# Patient Record
Sex: Male | Born: 1975 | Race: White | Hispanic: Yes | Marital: Single | Smoking: Never smoker
Health system: Southern US, Community
[De-identification: ages and names within clinical notes are randomized; demographics above are authoritative.]

## PROBLEM LIST (undated history)

## (undated) DIAGNOSIS — M5430 Sciatica, unspecified side: Secondary | ICD-10-CM

---

## 2003-01-18 ENCOUNTER — Observation Stay (HOSPITAL_COMMUNITY): Admission: EM | Admit: 2003-01-18 | Discharge: 2003-01-19 | Payer: Self-pay | Admitting: Emergency Medicine

## 2003-01-18 ENCOUNTER — Encounter (INDEPENDENT_AMBULATORY_CARE_PROVIDER_SITE_OTHER): Payer: Self-pay

## 2005-05-20 ENCOUNTER — Emergency Department (HOSPITAL_COMMUNITY): Admission: EM | Admit: 2005-05-20 | Discharge: 2005-05-20 | Payer: Self-pay | Admitting: Family Medicine

## 2005-05-25 ENCOUNTER — Encounter: Admission: RE | Admit: 2005-05-25 | Discharge: 2005-05-25 | Payer: Self-pay | Admitting: Family Medicine

## 2008-03-28 ENCOUNTER — Emergency Department (HOSPITAL_COMMUNITY): Admission: EM | Admit: 2008-03-28 | Discharge: 2008-03-28 | Payer: Self-pay | Admitting: Family Medicine

## 2008-03-30 ENCOUNTER — Ambulatory Visit: Payer: Self-pay | Admitting: Cardiovascular Disease

## 2008-03-30 ENCOUNTER — Encounter: Payer: Self-pay | Admitting: Cardiovascular Disease

## 2008-03-30 ENCOUNTER — Ambulatory Visit: Payer: Self-pay

## 2009-10-30 IMAGING — CR DG CHEST 2V
2 series · 2 of 2 positions shown · non-contrast
Comparison: None.

CLINICAL DATA: Chest pain.  Shortness of breath.

CHEST - 2 VIEW 03/28/2008:

[w chest pa]
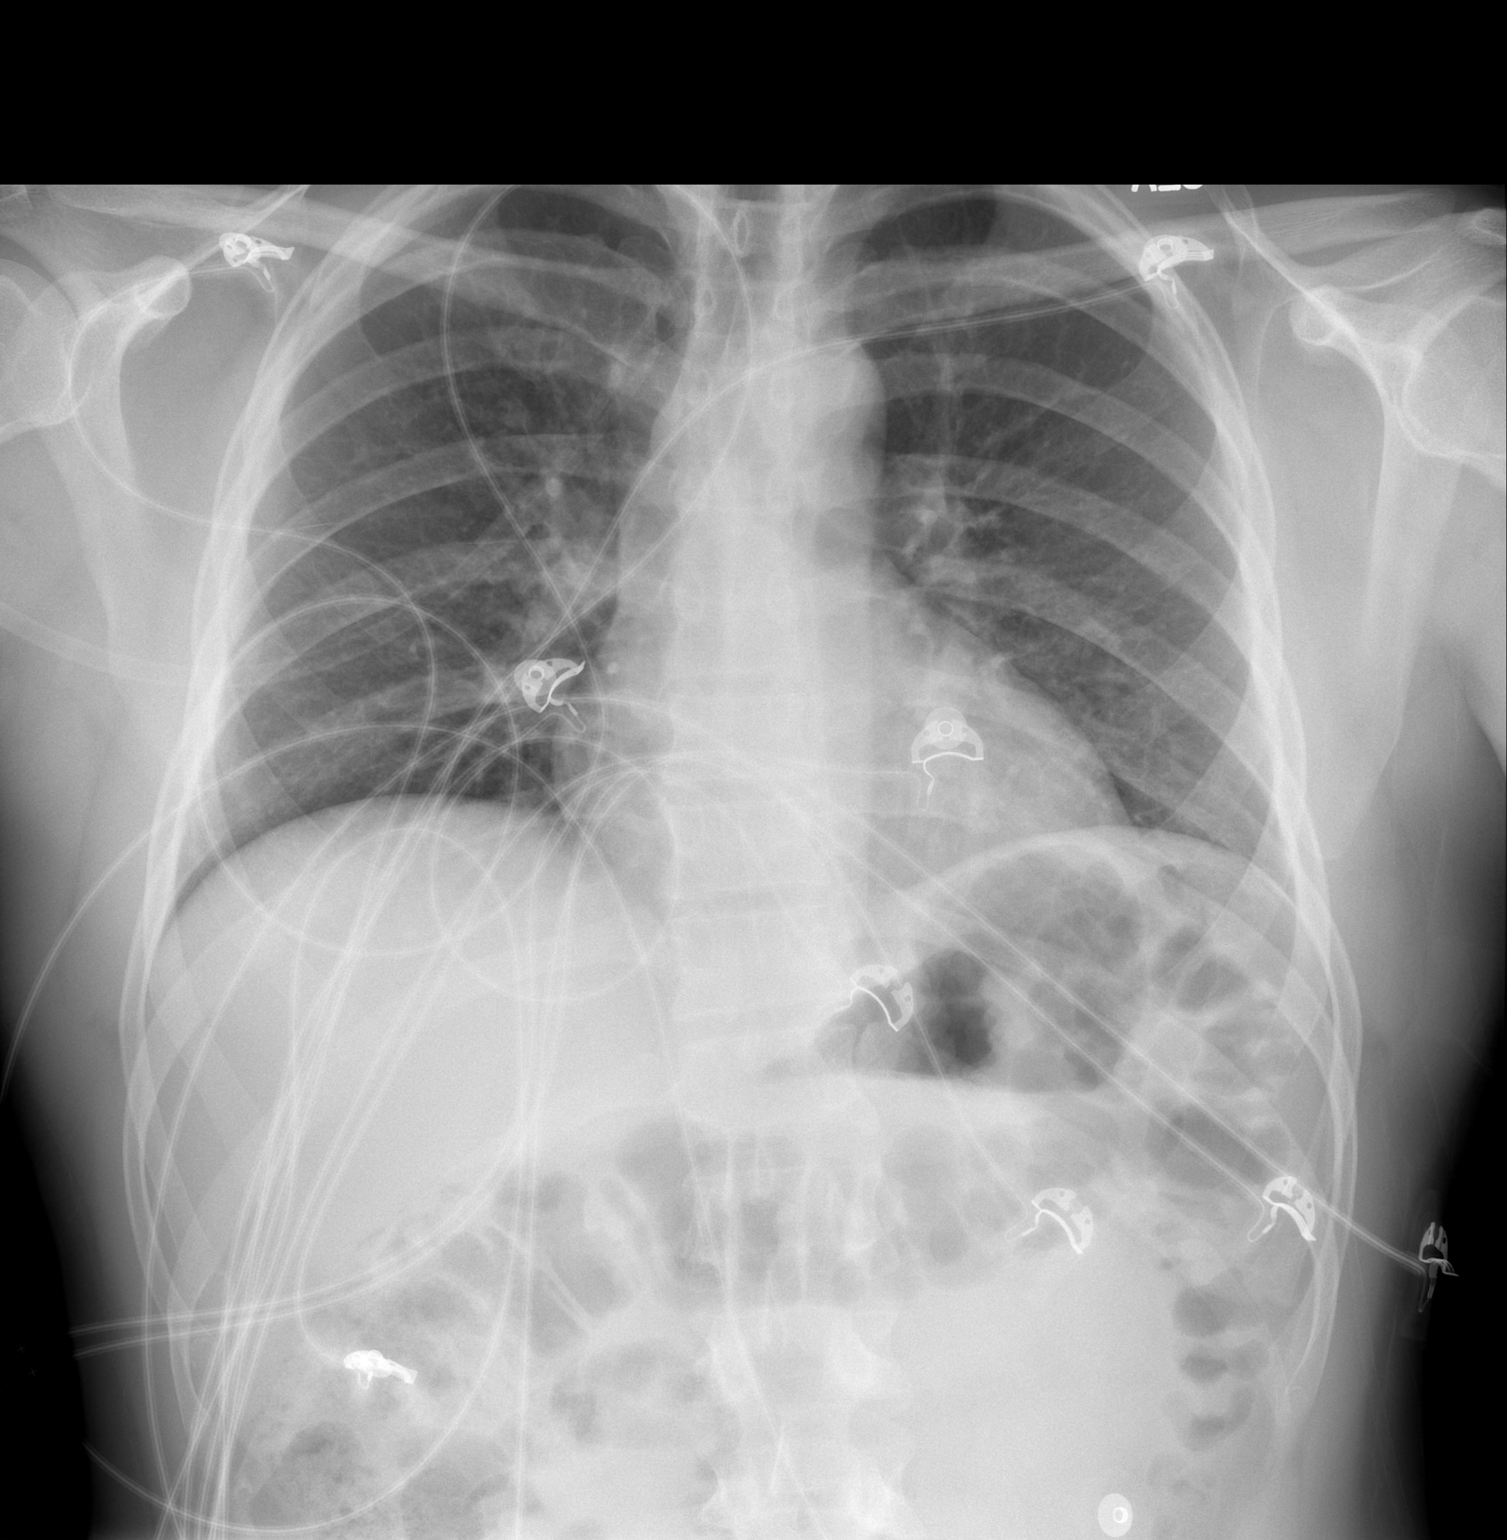

[w chest lat]
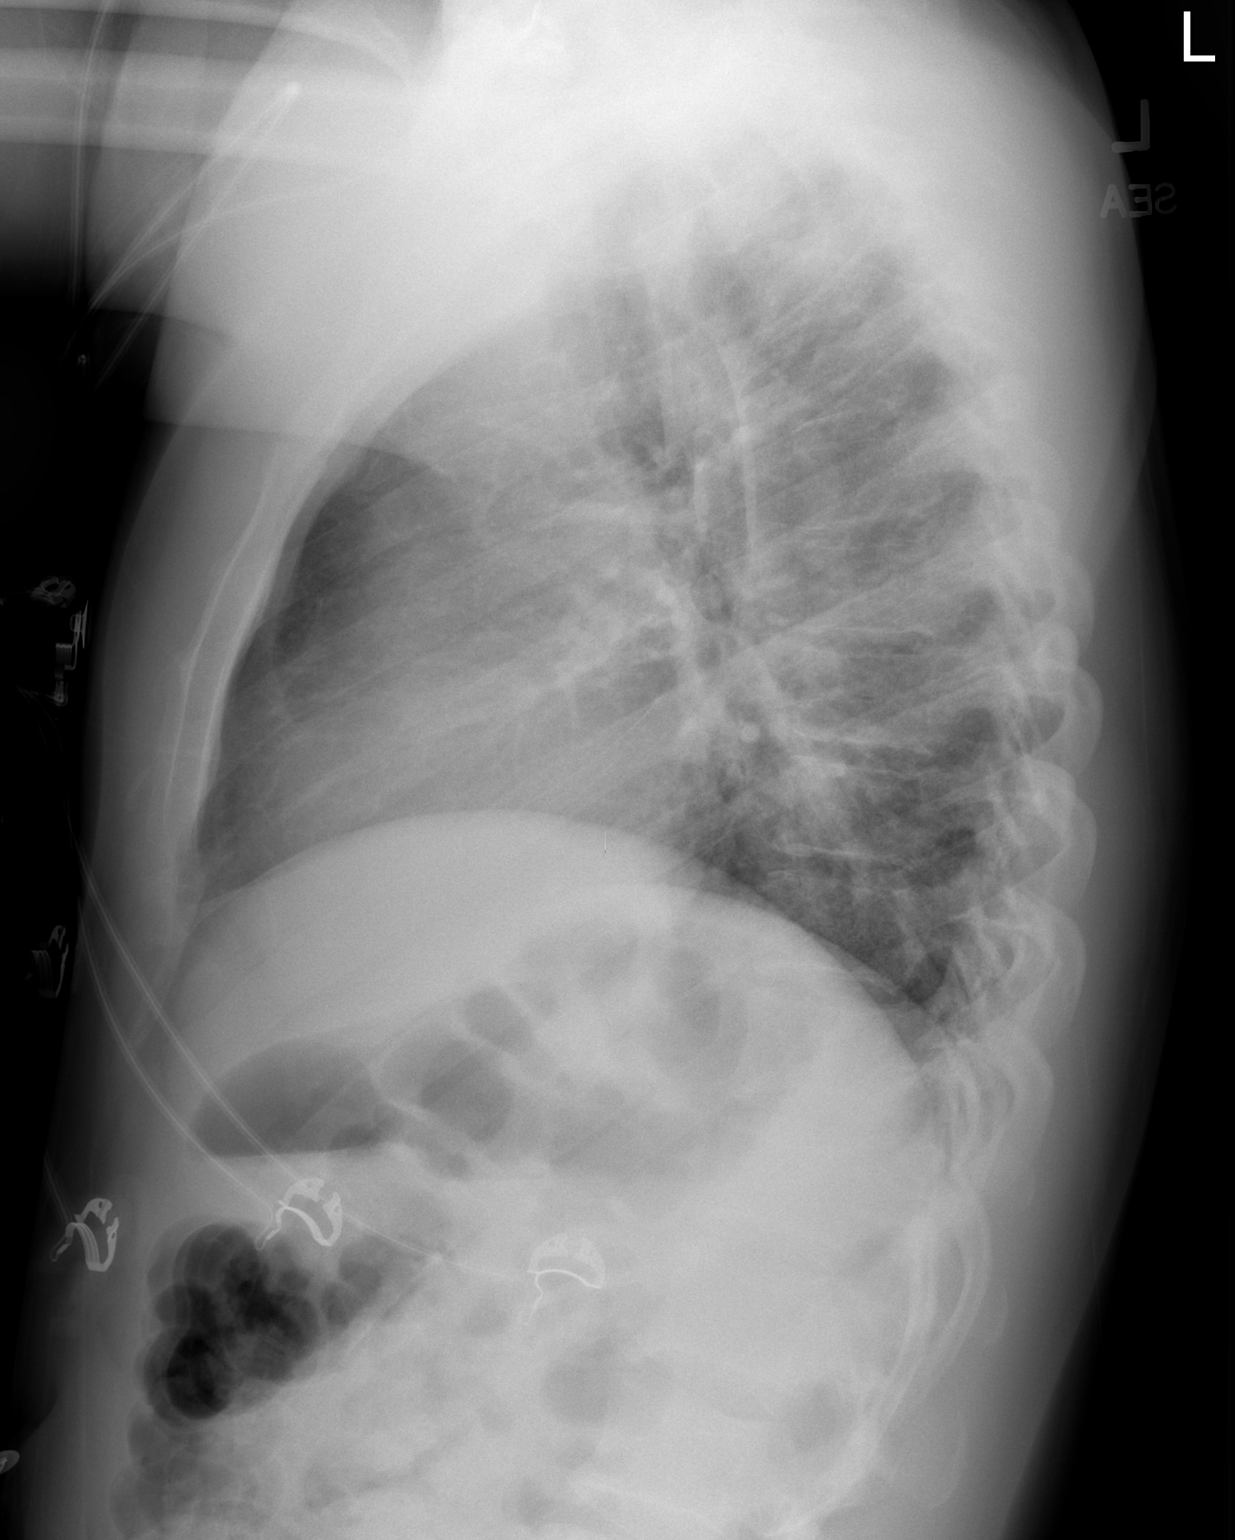

[2 of 2 positions shown; findings below may reference images not displayed]

FINDINGS: Suboptimal inspiration accounts for crowded
bronchovascular markings at the bases and accentuates the heart
size.  Taking this into account, cardiomediastinal silhouette
unremarkable and lungs clear.  Bronchovascular markings normal.  No
pleural effusions.  Visualized bony thorax intact.
IMPRESSION: Suboptimal inspiration.  No acute cardiopulmonary disease.

## 2010-02-01 ENCOUNTER — Emergency Department (HOSPITAL_COMMUNITY): Admission: EM | Admit: 2010-02-01 | Discharge: 2010-02-01 | Payer: Self-pay | Admitting: Family Medicine

## 2010-08-15 NOTE — Assessment & Plan Note (Signed)
[facility]                            CARDIOLOGY OFFICE NOTE   NAME:BENITEZVernis, Kent Leonard                       MRN:          [id number]  DATE:03/30/2008                            DOB:          [birth year]    Kent Leonard is a pleasant 33-year patient referred from [facility] ER  for chest pain and shortness of breath.   The patient was seen last Sunday.  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  He had some shortness of breath associated with it.  He was  seen in the ER.  He ruled out for myocardial infarction and had a normal  chest x-ray.  He had an abnormal EKG.  He has never had one before.  He  was sent here.  There is no previous history of congenital structural  heart disease.  His pain seemed to be improving.  He was given 800 of  Motrin in the hospital, but has not been taking it continuously since.  He has not had a cough or fever.  In general, he has not had a history  of tuberculosis or other infectious diseases.  He currently seems to be  improving in regards to his chest pain and his shortness of breath is  now gone.   He is not having any significant palpitations, PND, or orthopnea.  There  has been no lower extremity swelling.   The pain in his chest was temporarily related to a heavy 12-hour work  day involving heavy lifting of stone.   He has not had a previous stress test.   I reviewed the EKG done from the ER and he had sinus rhythm with  probable LVH and biphasic T-waves.  There is no previous history of  hypertension.  I suspect it is a normal variant for this patient.   His past medical history is otherwise benign.  He has had a previous  appendectomy.   He is separated [place] in [place]  He has been living with a  male who is with him today.  They have 2 children together.  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   FAMILY  HISTORY:  Noncontributory.   He is a nonsmoker, nondrinker.   Mother and father still alive.   He takes p.r.n. ibuprofen.   PHYSICAL EXAMINATION:  GENERAL:  Remarkable for healthy-appearing Kent  Leonard male in no distress.  VITAL SIGNS:  Blood pressure is 128/78, pulse 70 and regular,  respiratory rate 14, afebrile.  HEENT:  Unremarkable.  NECK:  Carotids are normal without bruit.  No lymphadenopathy,  thyromegaly, or JVP elevation.  LUNGS:  Clear with good diaphragmatic motion.  No wheezing or rub.  HEART:  There is an S1 and S2 with normal heart sounds.  PMI normal.  ABDOMEN:  Benign.  Bowel sounds positive.  No AAA, no tenderness, no  bruit, no hepatosplenomegaly, no hepatojugular reflux, or  tenderness  status post appendectomy.  EXTREMITIES:  Distal  pulses are intact.  No edema.  NEURO:  Nonfocal.  SKIN:  Warm and dry.  MUSCULOSKELETAL:  No muscular weakness.   EKG was as indicated.  ER records reviewed.   IMPRESSION:  1. Atypical chest pain in the setting of heavy lifting.  Continue      ibuprofen 600 t.i.d.  Follow up in 4 weeks.  No indication for      stress test at this time.  2. Abnormal EKG.  No evidence of murmur.  No evidence of hypertension.      Check 2-D echocardiogram to rule out occult hypertrophic      cardiomyopathy.  There is no LVOT gradient murmur.  3. Dyspnea, again likely related to pain or inflammation in his rib      cage.  Continue Motrin 3 times a day.  D-dimer was negative in the      hospital with a normal chest x-ray.  We will see him back in 4      weeks.  Hopefully, his symptoms will continue to resolve with anti-      inflammatories.  I told him it was okay to return to work.  I will      see him sooner if his echo shows structural heart abnormalities.     Kent Pick. Eden Emms, MD, [facility]  Electronically Signed    PCN/MedQ  DD: 03/30/2008  DT: 03/31/2008  Job #: [id number]

## 2010-08-18 NOTE — H&P (Signed)
NAME:  BLUE, WINTHER NO.:  [id number]   MEDICAL RECORD NO.:  [id number]                   PATIENT TYPE:  EMS   LOCATION:  ED                                   FACILITY:  [facility]   PHYSICIAN:  Currie Paris, M.D.           DATE OF BIRTH:  14-Feb-1976   DATE OF ADMISSION:  01/18/2003  DATE OF DISCHARGE:                                HISTORY & PHYSICAL   CHIEF COMPLAINT:  Abdominal pain.   HISTORY OF PRESENT ILLNESS:  This patient has presented to the emergency  room with about a 10 to 12 hour history of abdominal pain, which began about  mid day today and was primarily periumbilical. He had some [place] food that  made the pain worse. He became nauseated and the past has moved down into  the right lower quadrant. He has got intensification of the pain. He is more  comfortable laying still. It makes it worse to move around. He has had no  diarrhea and had a normal bowel movement yesterday evening. He has never had  any pain similar to this.   PAST MEDICAL HISTORY:  None.   PAST SURGICAL HISTORY:  None.   MEDICATIONS:  None.   SOCIAL HISTORY:  The patient does not smoke. Does not drink.   FAMILY HISTORY:  Negative for cancer, significant lung or heart disease.   REVIEW OF SYSTEMS:  HEENT:  Negative. CHEST:  No cough or shortness of  breath. HEART:  Negative for murmurs or hypertension. ABDOMEN:  Negative  except for HPI. GENITOURINARY:  Negative. EXTREMITIES:  Negative.   PHYSICAL EXAMINATION:  GENERAL:  The patient is alert, oriented, but  uncomfortable appearing 35 year old.  HEENT:  Head is normocephalic. Eyes, Pupils are equal, round, and reactive  to light and accommodation. Extraocular muscles intact. Nonicteric. Pharynx  is normal. Tongue protrudes to midline.  NECK:  Supple. There are no masses or thyromegaly noted.  LUNGS:  Normal respirations. Clear to auscultation.  HEART:  Regular rhythm. Somewhat slow at 56. No murmur, rub,  or gallops are  heard. He has got pulses carotid, femoral, and dorsalis pedis.  ABDOMEN:  Nondistended. It is got some guarding in the right lower quadrant  but the left side is relatively soft. He has got marked right lower quadrant  tenderness with referred rebound into the right lower quadrant. Bowel sounds  are present.  RECTAL:  Not done.  EXTREMITIES:  He has no cyanosis or edema. He has a full range of motion.  There is no edema noted.  NEUROLOGIC:  Mental status, he is oriented x3.   LABORATORY DATA:  Electrolytes are normal. His white count is 16,000 with a  left shift. Urinalysis showed a specific gravity of 1016. Negative for  blood.   IMPRESSION:  Clinically this patient has a fairly typical acute  appendicitis.   PLAN:  I have discussed with him proceeding to appendectomy.  I told him that  we would try to do him laparoscopically but it could be that he would need  to be converted to open. We have gone over the risks and complications of  surgery. He is uncomfortable and feels that he would like to proceed.                                               Currie Paris, M.D.    CJS/MEDQ  D:  01/18/2003  T:  01/19/2003  Job:  [id number]

## 2010-08-18 NOTE — Op Note (Signed)
NAMECELEDONIO, SORTINO                          ACCOUNT NO.:  [id number]   MEDICAL RECORD NO.:  [id number]                   PATIENT TYPE:  INP   LOCATION:  0101                                 FACILITY:  [facility]   PHYSICIAN:  Currie Paris, M.D.           DATE OF BIRTH:  April 30, 1975   DATE OF PROCEDURE:  01/18/2003  DATE OF DISCHARGE:                                 OPERATIVE REPORT   PREOPERATIVE DIAGNOSIS:  Acute appendicitis.   POSTOPERATIVE DIAGNOSIS:  Acute appendicitis.   OPERATION:  Laparoscopic appendectomy.   SURGEON:  Currie Paris, M.D.   ANESTHESIA:  General endotracheal.   CLINICAL HISTORY:  This patient is a 35 year old with a fairly classic  history for acute appendicitis.   DESCRIPTION OF PROCEDURE:  The patient was seen in the holding area and had  no further questions.  He was taken to the operating room and after  satisfactory general endotracheal anesthesia had been obtained, the abdomen  was prepped and draped and a Foley catheter placed.  Marcaine 0.25% was used  for each incision.  The umbilical incision was made first, the fascia  opened, and the peritoneal cavity entered under direct vision.  A  pursestring was placed, the Hasson introduced, and the abdomen was  insufflated to 15.  With a camera in place, a 5 mm trocar was placed in the  right upper quadrant under direct vision and the 10/11 in the left lower  quadrant, also under direct vision.  The patient was placed in some reverse  Trendelenburg and tilted to the left and the camera placed in the left lower  quadrant port.   The appendix was noted to be tense, turgid, and distended, with some acute  inflammatory changes and some edema of the mesoappendix consistent with an  appendicitis.  The appendix was grasped with a clamp and elevated and the  Harmonic scalpel used to free up a few peritoneal attachments and then to  divide the mesoappendix down to the base of the appendix.  This  was done  nicely with no bleeding.  Once I had the base of the appendix identified and  could see it circumferentially to be sure we had the anatomy identified  properly, I inserted the Endo-GIA, fired it across the base of the appendix.  The appendix was placed in an EndoCatch bag and brought out the umbilical  port site.  The umbilical port was replaced and the abdomen reinsufflated.  I irrigated to make sure everything was dry.  We ran the last couple of feet  of small bowel just to look at that and make sure there were no other gross  abnormalities.  The liver and gallbladder all appeared to be normal.  There  was no pelvic or peritoneal fluid.  No other signs of inflammatory changes.   The 5 mm trocar was removed from the right upper quadrant and then the  left  lower quadrant trocar removed, both under direct vision.  There was no  bleeding.  The umbilical port was removed and the abdomen deflated.  The  pursestring was tied down.  The skin was closed with 4-0 Monocryl  subcuticular and Dermabond.   The patient tolerated the procedure well.  There were no operative  complications, and all counts were correct.                                               Currie Paris, M.D.    CJS/MEDQ  D:  01/18/2003  T:  01/19/2003  Job:  [id number]

## 2011-01-05 LAB — BASIC METABOLIC PANEL
Calcium: 8.7 mg/dL (ref 8.4–10.5)
Chloride: 106 mEq/L (ref 96–112)
Glucose, Bld: 96 mg/dL (ref 70–99)
Potassium: 3.9 mEq/L (ref 3.5–5.1)
Sodium: 138 mEq/L (ref 135–145)

## 2011-01-05 LAB — DIFFERENTIAL
Eosinophils Absolute: 0 10*3/uL (ref 0.0–0.7)
Lymphocytes Relative: 14 % (ref 12–46)
Monocytes Relative: 8 % (ref 3–12)
Neutro Abs: 8.4 10*3/uL — ABNORMAL HIGH (ref 1.7–7.7)

## 2011-01-05 LAB — CBC
Hemoglobin: 15.2 g/dL (ref 13.0–17.0)
MCHC: 34.2 g/dL (ref 30.0–36.0)
Platelets: 243 10*3/uL (ref 150–400)
RBC: 4.99 MIL/uL (ref 4.22–5.81)

## 2011-01-05 LAB — POCT CARDIAC MARKERS
CKMB, poc: <1 ng/mL — ABNORMAL LOW (ref 1.0–8.0)
Troponin i, poc: <0.05 ng/mL (ref 0.00–0.09)
Troponin i, poc: <0.05 ng/mL (ref 0.00–0.09)

## 2019-02-04 ENCOUNTER — Encounter (HOSPITAL_COMMUNITY): Payer: Self-pay | Admitting: Obstetrics and Gynecology

## 2019-02-04 ENCOUNTER — Emergency Department (HOSPITAL_COMMUNITY)
Admission: EM | Admit: 2019-02-04 | Discharge: 2019-02-04 | Disposition: A | Discharge status: Home / Self Care | Payer: Self-pay | Attending: Emergency Medicine | Admitting: Emergency Medicine

## 2019-02-04 DIAGNOSIS — M5432 Sciatica, left side: Secondary | ICD-10-CM | POA: Insufficient documentation

## 2019-02-04 HISTORY — DX: Sciatica, unspecified side: M54.30

## 2019-02-04 MED ORDER — KETOROLAC TROMETHAMINE 15 MG/ML IJ SOLN
15.0000 mg | Freq: Once | INTRAMUSCULAR | Status: AC
Start: 2019-02-04 — End: 2019-02-04
  Administered 2019-02-04: 18:00:00 15 mg via INTRAVENOUS
  Filled 2019-02-04: qty 1

## 2019-02-04 NOTE — ED Triage Notes (Signed)
Per EMS: Patient reports severe back pain that runs down his leg. When Ems arrived on scene patient was reportedly crying. Patient given 162mcgs of fentany IV via EMS.

## 2019-02-04 NOTE — Discharge Instructions (Signed)
Please continue the steroid taper as previously prescribed.  Recommend continuing the naproxen and the Flexeril as well.  Please call your primary doctor back tomorrow to request update on the results of your MRI.  If you develop numbness, weakness, bladder or bowel incontinence, urinary retention, fever, please return to ER for reassessment.

## 2019-02-05 NOTE — ED Provider Notes (Signed)
[facility]-EMERGENCY DEPT Provider Note   CSN: [id number] Arrival date & time: 02/04/19  1516     History   Chief Complaint Chief Complaint  Patient presents with  . Back Pain  . Sciatica    HPI Kent Leonard is a 43 y.o. male.  Presents emerged department chief complaint low back pain.  Patient states that he has been having pain in his low back for the past 3 months.  Over the past week ago has had significant increase in worsening pain.  States pain radiates down his left leg.  Has been seen by a couple other doctors recently for this.  Went to an outpatient clinic and was prescribed naproxen, Flexeril and a steroid taper.  States this medicine seem to help somewhat but have not completely taken away his pain.  Reports that he had an MRI done at an outpatient imaging facility but has not received the results of his MRI.  Denies any associated numbness, weakness, bladder or bowel incontinence, urinary retention.  No saddle anesthesia.  No prior history IVDU.     HPI  Past Medical History:  Diagnosis Date  . Sciatica     There are no active problems to display for this patient.   Home Medications    Prior to Admission medications   Medication Sig Start Date End Date Taking? Authorizing Provider  cyclobenzaprine (FLEXERIL) 10 MG tablet Take 10 mg by mouth at bedtime. 02/02/19  Yes [provider]  naproxen (NAPROSYN) 500 MG tablet Take 500 mg by mouth 2 (two) times daily. 02/02/19  Yes [provider]  predniSONE (DELTASONE) 10 MG tablet See admin instructions. 02/02/19  Yes [provider]    Family History No family history on file.  Social History Social History   Tobacco Use  . Smoking status: Never Smoker  Substance Use Topics  . Alcohol use: Yes    Comment: Weekly  . Drug use: Not Currently     Allergies   Patient has no known allergies.   Review of Systems Review of Systems  Constitutional: Negative for  chills and fever.  HENT: Negative for ear pain and sore throat.   Eyes: Negative for pain and visual disturbance.  Respiratory: Negative for cough and shortness of breath.   Cardiovascular: Negative for chest pain and palpitations.  Gastrointestinal: Negative for abdominal pain and vomiting.  Genitourinary: Negative for dysuria and hematuria.  Musculoskeletal: Positive for back pain. Negative for arthralgias.  Skin: Negative for color change and rash.  Neurological: Negative for seizures and syncope.  All other systems reviewed and are negative.    Physical Exam Updated Vital Signs BP 125/81   Pulse 78   Temp 98.9 F (37.2 C)   Resp 13   SpO2 98%   Physical Exam Vitals signs and nursing note reviewed.  Constitutional:      Appearance: He is well-developed.  HENT:     Head: Normocephalic and atraumatic.  Eyes:     Conjunctiva/sclera: Conjunctivae normal.  Neck:     Musculoskeletal: Neck supple.  Cardiovascular:     Rate and Rhythm: Normal rate and regular rhythm.     Heart sounds: No murmur.  Pulmonary:     Effort: Pulmonary effort is normal. No respiratory distress.     Breath sounds: Normal breath sounds.  Abdominal:     Palpations: Abdomen is soft.     Tenderness: There is no abdominal tenderness.  Musculoskeletal:        General:  No swelling, tenderness or deformity.     Comments: Positive straight leg test, tenderness palpation over left lateral back  Skin:    General: Skin is warm and dry.     Capillary Refill: Capillary refill takes less than 2 seconds.  Neurological:     General: No focal deficit present.     Mental Status: He is alert and oriented to person, place, and time.     Comments: Bilateral 5 strength in bilateral lower extremities, sensation to light touch intact in bilateral lower extremities      ED Treatments / Results  Labs (all labs ordered are listed, but only abnormal results are displayed) Labs Reviewed - No data to display  EKG  None  Radiology No results found.  Procedures Procedures (including critical care time)  Medications Ordered in ED Medications  ketorolac (TORADOL) 15 MG/ML injection 15 mg (15 mg Intravenous Given 02/04/19 1741)     Initial Impression / Assessment and Plan / ED Course  I have reviewed the triage vital signs and the nursing notes.  Pertinent labs & imaging results that were available during my care of the patient were reviewed by me and considered in my medical decision making (see chart for details).        43 year old lady presents to the ER with low back pain radiating to left leg.  Concern for sciatica.  He has normal neurologic exam, no red flag symptoms.  Is well-appearing with no fever.  No recent trauma or injury.  Do not see indication for acute imaging at this time.  Reports has MRI done recently as outpatient.  Already on steroid taper.  Will give dose of pain medicine here and recommend recheck with his primary doctor for results of his MRI and further assistance with pain management.  Reviewed return precautions, will discharge home.    After the discussed management above, the patient was determined to be safe for discharge.  The patient was in agreement with this plan and all questions regarding their care were answered.  ED return precautions were discussed and the patient will return to the ED with any significant worsening of condition.    Final Clinical Impressions(s) / ED Diagnoses   Final diagnoses:  Left sided sciatica    ED Discharge Orders    None       Lucrezia Starch, MD 02/05/19 0005
